# Patient Record
Sex: Male | Born: 1985 | Race: White | Hispanic: No | Marital: Single
Health system: Southern US, Community
[De-identification: ages and names within clinical notes are randomized; demographics above are authoritative.]

## PROBLEM LIST (undated history)

## (undated) DIAGNOSIS — G4730 Sleep apnea, unspecified: Secondary | ICD-10-CM

---

## 2019-08-12 ENCOUNTER — Other Ambulatory Visit: Payer: Self-pay

## 2019-08-12 DIAGNOSIS — Z20822 Contact with and (suspected) exposure to covid-19: Secondary | ICD-10-CM

## 2019-08-14 LAB — NOVEL CORONAVIRUS, NAA: SARS-CoV-2, NAA: NOT DETECTED

## 2019-10-23 ENCOUNTER — Encounter: Payer: Self-pay | Admitting: Emergency Medicine

## 2019-10-23 ENCOUNTER — Emergency Department
Admission: EM | Admit: 2019-10-23 | Discharge: 2019-10-23 | Disposition: A | Discharge status: Home / Self Care | Payer: 59 | Source: Home / Self Care

## 2019-10-23 ENCOUNTER — Emergency Department (INDEPENDENT_AMBULATORY_CARE_PROVIDER_SITE_OTHER): Payer: 59

## 2019-10-23 ENCOUNTER — Other Ambulatory Visit: Payer: Self-pay

## 2019-10-23 DIAGNOSIS — R81 Glycosuria: Secondary | ICD-10-CM | POA: Diagnosis not present

## 2019-10-23 DIAGNOSIS — R319 Hematuria, unspecified: Secondary | ICD-10-CM

## 2019-10-23 DIAGNOSIS — N12 Tubulo-interstitial nephritis, not specified as acute or chronic: Secondary | ICD-10-CM

## 2019-10-23 DIAGNOSIS — R109 Unspecified abdominal pain: Secondary | ICD-10-CM

## 2019-10-23 HISTORY — DX: Sleep apnea, unspecified: G47.30

## 2019-10-23 LAB — POCT FASTING CBG KUC MANUAL ENTRY: POCT Glucose (KUC): 116 mg/dL — AB (ref 70–99)

## 2019-10-23 LAB — POCT CBC W AUTO DIFF (K'VILLE URGENT CARE)

## 2019-10-23 LAB — POCT URINALYSIS DIP (MANUAL ENTRY)
Bilirubin, UA: NEGATIVE
Glucose, UA: 100 mg/dL — AB
Ketones, POC UA: NEGATIVE mg/dL
Leukocytes, UA: NEGATIVE
Nitrite, UA: POSITIVE — AB
Protein Ur, POC: 30 mg/dL — AB
Spec Grav, UA: 1.02 (ref 1.010–1.025)
Urobilinogen, UA: 1 E.U./dL
pH, UA: 5.5 (ref 5.0–8.0)

## 2019-10-23 MED ORDER — SULFAMETHOXAZOLE-TRIMETHOPRIM 800-160 MG PO TABS: 1.0000 | ORAL_TABLET | Freq: Two times a day (BID) | ORAL | 0 refills | Status: AC

## 2019-10-23 NOTE — ED Provider Notes (Signed)
[facility]    CSN: [id number] Arrival date & time: 10/23/19  1122      History   Chief Complaint Chief Complaint  Patient presents with  . Urinary Tract Infection    HPI Joseph Baird is a 34 y.o. male.   HPI  Patient presents for evaluation of possible UTI.  Patient endorses dysuria along with urinary frequency gradually worsening over the course of 1 day.  He took an AZO which has improved pain.  He denies chills, fever, nausea, vomiting.  Patient also endorses some right mild flank pain.  Has no history of renal stones.  Past Medical History:  Diagnosis Date  . Sleep apnea     There are no problems to display for this patient.   History reviewed. No pertinent surgical history.     Home Medications    Prior to Admission medications   Not on File    Family History History reviewed. No pertinent family history.  Social History Social History   Tobacco Use  . Smoking status: Not on file  Substance Use Topics  . Alcohol use: Not on file  . Drug use: Not on file     Allergies   Patient has no known allergies.   Review of Systems Review of Systems Pertinent negatives listed in HPI  Physical Exam Triage Vital   ED Triage Vitals  Enc Vitals Group     BP 10/23/19 1229 133/86     Pulse Rate 10/23/19 1229 85     Resp --      Temp 10/23/19 1229 97.7 F (36.5 C)     Temp Source 10/23/19 1229 Oral     SpO2 10/23/19 1229 96 %     Weight 10/23/19 1230 (!) 381 lb (172.8 kg)     Height 10/23/19 1230 6\' 1"  (1.854 m)     Head Circumference --      Peak Flow --      Pain Score 10/23/19 1230 4     Pain Loc --      Pain Edu? --      Excl. in GC? --    No data found.  Updated Vital Signs BP 133/86 (BP Location: Left Arm)   Pulse 85   Temp 97.7 F (36.5 C) (Oral)   Ht 6\' 1"  (1.854 m)   Wt (!) 381 lb (172.8 kg)   SpO2 96%   BMI 50.27 kg/m   Visual Acuity Right Eye Distance:   Left Eye Distance:   Bilateral Distance:    Right Eye  Near:   Left Eye Near:    Bilateral Near:     Physical Exam General appearance: alert, well developed, well nourished, cooperative and in no distress Head: Normocephalic, without obvious abnormality, atraumatic Respiratory: Respirations even and unlabored, normal respiratory rate Heart: rate and rhythm normal. No gallop or murmurs noted on exam  Abdomen: BS +, no distention, no rebound tenderness, or no mass, mild CVA tenderness right Extremities: No gross deformities Skin: Skin color, texture, turgor normal. No rashes seen  Psych: Appropriate mood and affect. Neurologic:Alert, oriented to person, place, and time, thought content appropriate.  UC Treatments / Results  Labs (all labs ordered are listed, but only abnormal results are displayed) Labs Reviewed  POCT URINALYSIS DIP (MANUAL ENTRY) - Abnormal; Notable for the following components:      Result Value   Color, UA orange (*)    Clarity, UA cloudy (*)    Glucose, UA =100 (*)  Blood, UA small (*)    Protein Ur, POC =30 (*)    Nitrite, UA Positive (*)    All other components within normal limits  POCT FASTING CBG [facility] MANUAL ENTRY - Abnormal; Notable for the following components:   POCT Glucose ([facility]) 116 (*)    All other components within normal limits  URINE CULTURE  POCT CBC W AUTO DIFF ([place])    EKG   Radiology DG Abdomen 1 View  Result Date: 10/23/2019 CLINICAL DATA:  Right flank pain with hematuria and pyelonephritis. EXAM: ABDOMEN - 1 VIEW COMPARISON:  None. FINDINGS: The bowel gas pattern is normal. No radio-opaque calculi are seen. A well-corticated ossific fragment is seen near the right hip. IMPRESSION: Normal bowel gas pattern. No radio-opaque calculi are identified. Electronically Signed   By: Zerita Boers M.D.   On: 10/23/2019 13:43    Procedures Procedures (including critical care time)  Medications Ordered in UC Medications - No data to display  Initial Impression / Assessment and  Plan / UC Course  I have reviewed the triage vital signs and the nursing notes.  Pertinent labs & imaging results that were available during my care of the patient were reviewed by me and considered in my medical decision making (see chart for details).    Pyelonephritis, unable to rule out a renal stone given presence of hematuria although KUB is negative.  Patient given strict return precautions if symptoms do not improve with current therapy.  CBC is reassuring only mild leukocytosis present. Initiate Bactrim BID x 14 days. Glucose in urine, likely secondary to AZO use. BS mildly elevated although pt is not fasting. Recommended follow-up with a primary care provider to rule out new onset diabetes and complete physical. Final Clinical Impressions(s) / UC Diagnoses   Final diagnoses:  Glucose found in urine on examination  Pyelonephritis     Discharge Instructions     If symptoms worsen or do not improve, return for further evaluation.    ED Prescriptions    Medication Sig Dispense Auth. Provider   sulfamethoxazole-trimethoprim (BACTRIM DS) 800-160 MG tablet Take 1 tablet by mouth 2 (two) times daily for 14 days. 28 tablet Scot Jun, FNP     PDMP not reviewed this encounter.   Scot Jun, FNP 10/25/19 1156

## 2019-10-23 NOTE — ED Triage Notes (Signed)
Pt here with poss UTI that started 2 days ago. Increased urination with slight hematuria and discomfort. Started taking AZO yesterday with some relief; noticed hematuria today. Denies fever,chills,back pain.

## 2019-10-23 NOTE — Discharge Instructions (Signed)
If symptoms worsen or do not improve, return for further evaluation.

## 2019-10-24 LAB — URINE CULTURE
MICRO NUMBER:: 10027005
Result:: NO GROWTH
SPECIMEN QUALITY:: ADEQUATE

## 2021-09-02 IMAGING — DX DG ABDOMEN 1V
2 series · 2 of 2 positions shown · non-contrast
Comparison: None.

CLINICAL DATA: Right flank pain with hematuria and pyelonephritis.

EXAM:
ABDOMEN - 1 VIEW

[abdomen kub (1 of 2)]
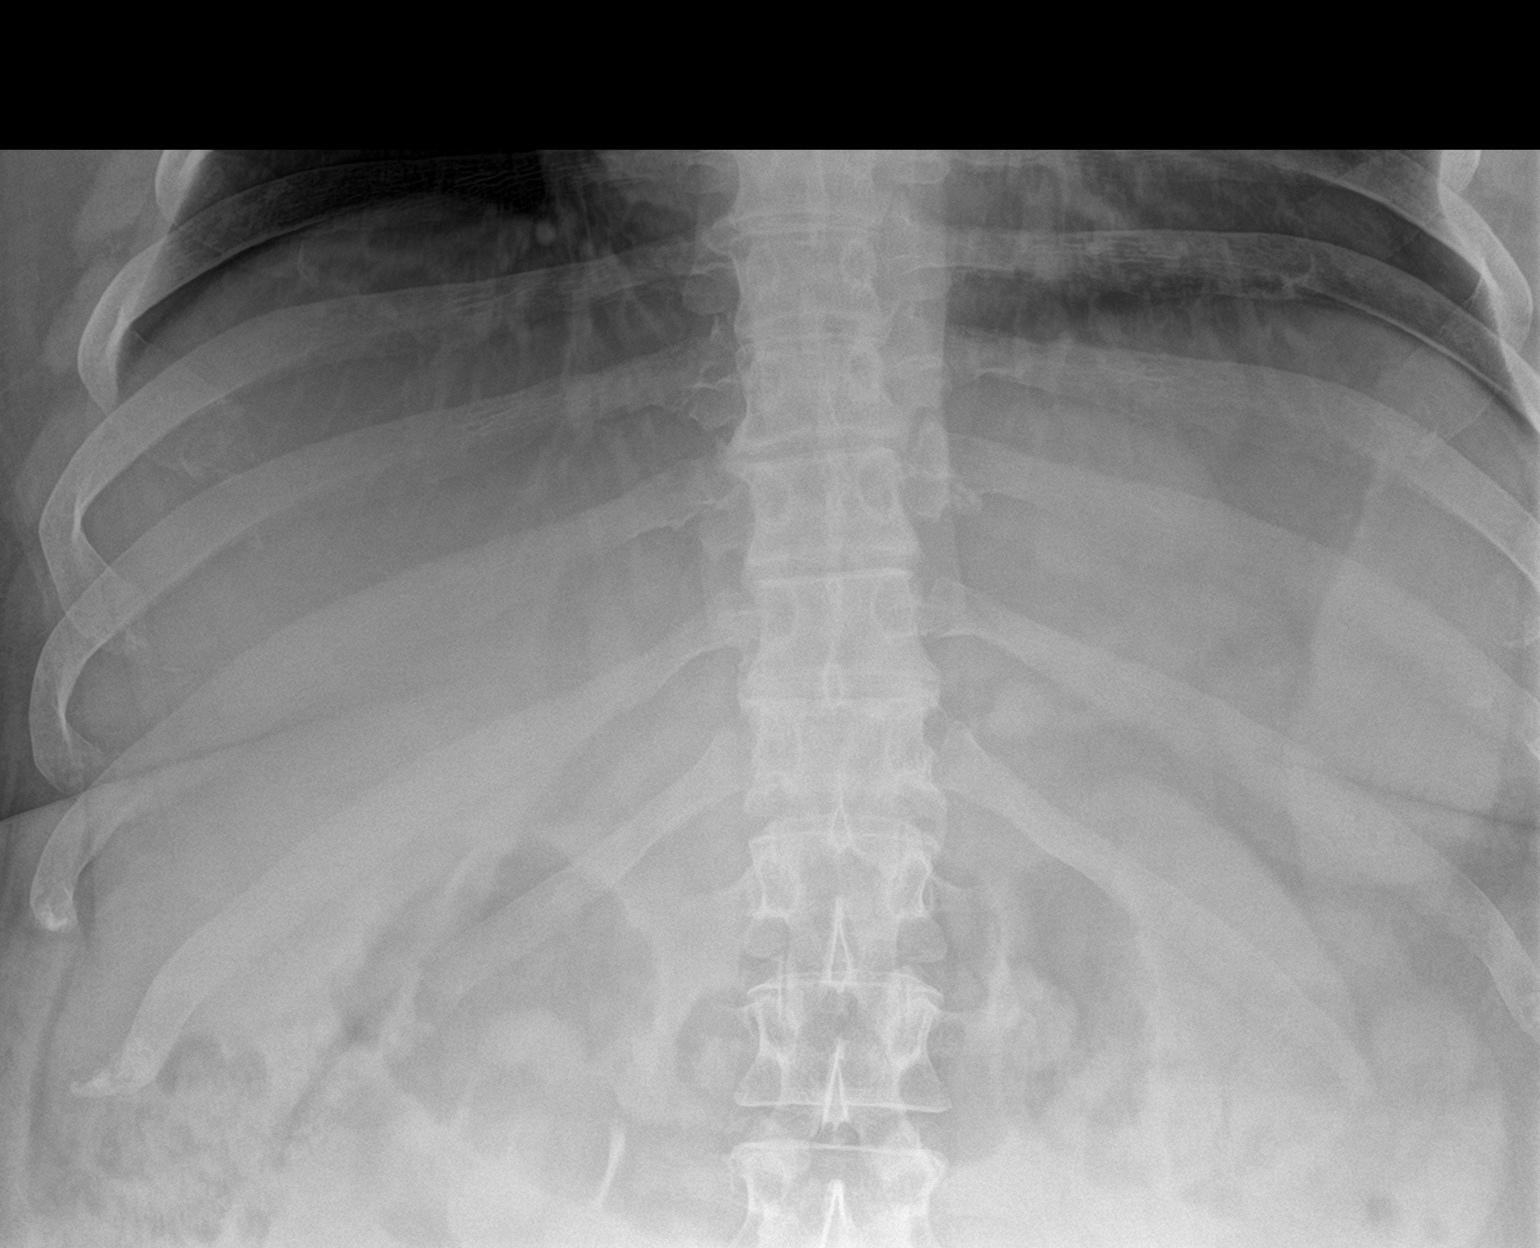

[abdomen kub (2 of 2)]
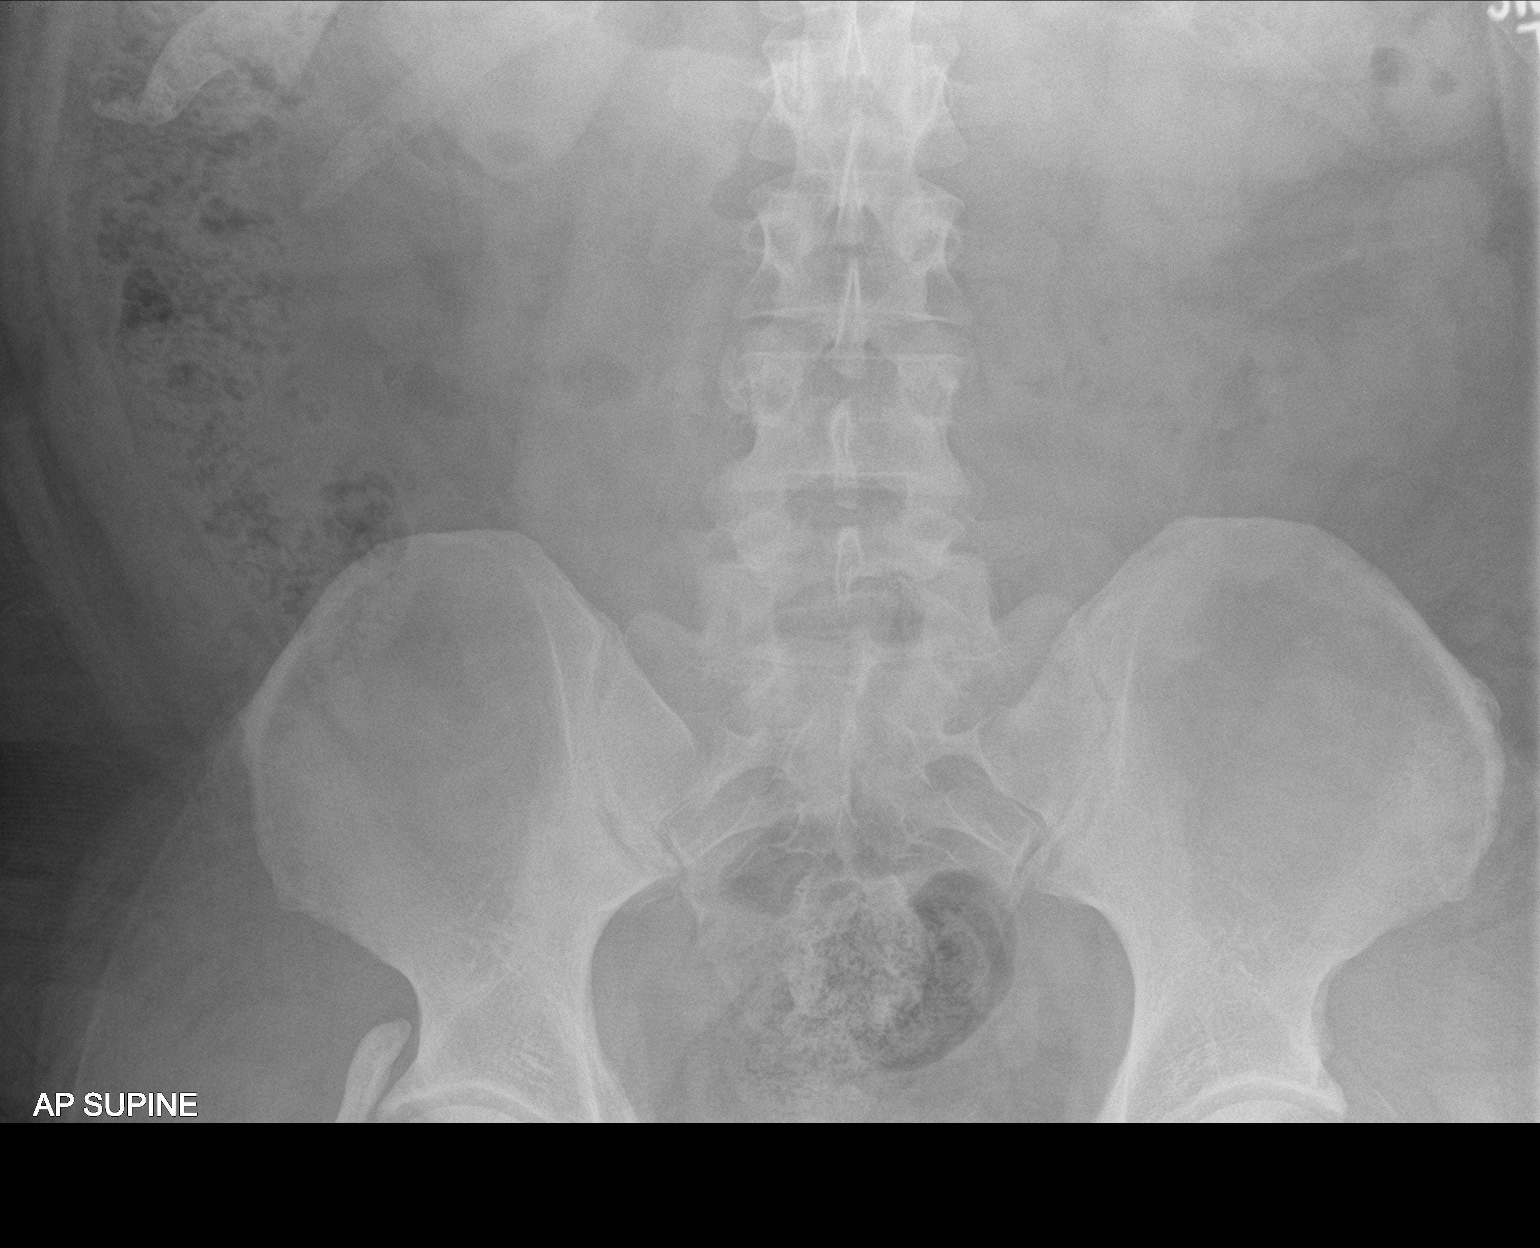

[2 of 2 positions shown; findings below may reference images not displayed]

FINDINGS: The bowel gas pattern is normal. No radio-opaque calculi are seen. A
well-corticated ossific fragment is seen near the right hip.
IMPRESSION: Normal bowel gas pattern. No radio-opaque calculi are identified.
